# Patient Record
Sex: Male | Born: 1985 | Race: White | Hispanic: No | Marital: Married | Smoking: Never smoker
Health system: Southern US, Community
[De-identification: ages and names within clinical notes are randomized; demographics above are authoritative.]

## PROBLEM LIST (undated history)

## (undated) HISTORY — PX: HERNIA REPAIR: SHX51

---

## 2014-04-06 ENCOUNTER — Emergency Department: Payer: Self-pay | Admitting: Emergency Medicine

## 2014-04-14 ENCOUNTER — Emergency Department: Payer: Self-pay | Admitting: Emergency Medicine
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
## 2015-11-10 ENCOUNTER — Encounter: Payer: Self-pay | Admitting: Emergency Medicine

## 2015-11-10 ENCOUNTER — Emergency Department
Admission: EM | Admit: 2015-11-10 | Discharge: 2015-11-10 | Disposition: A | Discharge status: Home / Self Care | Attending: Emergency Medicine | Admitting: Emergency Medicine

## 2015-11-10 DIAGNOSIS — M79605 Pain in left leg: Secondary | ICD-10-CM | POA: Insufficient documentation

## 2015-11-10 DIAGNOSIS — M79604 Pain in right leg: Secondary | ICD-10-CM | POA: Diagnosis not present

## 2015-11-10 MED ORDER — NAPROXEN 500 MG PO TABS: 500.0000 mg | ORAL_TABLET | Freq: Two times a day (BID) | ORAL | Status: AC

## 2015-11-10 NOTE — ED Notes (Addendum)
BIL LEG PAIN - BEGAN AFTER RUNNING. HASN'T TAKEN MEDS BUT HAS BEEN TRYING TO STRETCH. PAIN TO INSIDES OF BOTH CALVES

## 2015-11-10 NOTE — ED Provider Notes (Signed)
[facility] Emergency Department Provider Note  ____________________________________________  Time seen: Approximately 9:57 PM  I have reviewed the triage vital signs and the nursing notes.   HISTORY  Chief Complaint Leg Pain    HPI Jorge Pierce is a 29 y.o. male patient complaining of bilateral leg pain. Patient state onset when started running. Patient state the pain is on the medial aspect of his calves. Patient stated no palliative measures taken for this complaint. Patient stated onset began 2 months ago when he restarted running program. Patient rates his pain as a 5/10.   History reviewed. No pertinent past medical history.  There are no active problems to display for this patient.   Past Surgical History  Procedure Laterality Date  . Hernia repair      Current Outpatient Rx  Name  Route  Sig  Dispense  Refill  . naproxen (NAPROSYN) 500 MG tablet   Oral   Take 1 tablet (500 mg total) by mouth 2 (two) times daily with a meal.   60 tablet   0     Allergies Review of patient's allergies indicates no known allergies.  No family history on file.  Social History Social History  Substance Use Topics  . Smoking status: Never Smoker   . Smokeless tobacco: None  . Alcohol Use: No    Review of Systems Constitutional: No fever/chills Eyes: No visual changes. ENT: No sore throat. Cardiovascular: Denies chest pain. Respiratory: Denies shortness of breath. Gastrointestinal: No abdominal pain.  No nausea, no vomiting.  No diarrhea.  No constipation. Genitourinary: Negative for dysuria. Musculoskeletal: Bilateral lower leg pain Skin: Negative for rash. Neurological: Negative for headaches, focal weakness or numbness. 10-point ROS otherwise negative.  ____________________________________________   PHYSICAL EXAM:  VITAL SIGNS: ED Triage Vitals  Enc Vitals Group     BP 11/10/15 2124 148/84 mmHg     Pulse Rate 11/10/15 2124 76   Resp --      Temp 11/10/15 2124 97 F (36.1 C)     Temp src --      SpO2 11/10/15 2124 100 %     Weight 11/10/15 2124 205 lb (92.987 kg)     Height 11/10/15 2124  (1.753 m)     Head Cir --      Peak Flow --      Pain Score --      Pain Loc --      Pain Edu? --      Excl. in GC? --     Constitutional: Alert and oriented. Well appearing and in no acute distress. Eyes: Conjunctivae are normal. PERRL. EOMI. Head: Atraumatic. Nose: No congestion/rhinnorhea. Mouth/Throat: Mucous membranes are moist.  Oropharynx non-erythematous. Neck: No stridor.  No cervical spine tenderness to palpation. Hematological/Lymphatic/Immunilogical: No cervical lymphadenopathy. Cardiovascular: Normal rate, regular rhythm. Grossly normal heart sounds.  Good peripheral circulation. Respiratory: Normal respiratory effort.  No retractions. Lungs CTAB. Gastrointestinal: Soft and nontender. No distention. No abdominal bruits. No CVA tenderness. Musculoskeletal: No deformity erythema or edema.Patient has moderate guarding palpation interosseous membranes. Patient demonstrates normal gait. Patient pain increases with squatting. Neurologic:  Normal speech and language. No gross focal neurologic deficits are appreciated. No gait instability. Skin:  Skin is warm, dry and intact. No rash noted. Psychiatric: Mood and affect are normal. Speech and behavior are normal.  ____________________________________________   LABS (all labs ordered are listed, but only abnormal results are displayed)  Labs Reviewed - No data to display ____________________________________________  EKG   ____________________________________________  RADIOLOGY   ____________________________________________   PROCEDURES  Procedure(s) performed: None  Critical Care performed: No  ____________________________________________   INITIAL IMPRESSION / ASSESSMENT AND PLAN / ED COURSE  Pertinent labs & imaging results that were  available during my care of the patient were reviewed by me and considered in my medical decision making (see chart for details).  Bilateral lower leg pain consistent with shin splints. Patient will be given contact information for orthopedics for further evaluation and treatment. Patient given discharge care instructions and a prescription for naproxen. ____________________________________________   FINAL CLINICAL IMPRESSION(S) / ED DIAGNOSES  Final diagnoses:  Leg pain, bilateral      Joni ReiningRonald K Debra Calabretta, PA-C 11/10/15 2214  Sharman CheekPhillip Stafford, MD 11/10/15 2329

## 2015-11-10 NOTE — ED Notes (Addendum)
Patient ambulatory to triage with steady gait, without difficulty or distress noted; pt reports bilat lower leg pain with running or when standing first thing in the morning [id number]; denies pain at present

## 2015-11-10 NOTE — ED Notes (Signed)
AAOx3.  Skin warm and dry. NAD.  Ambulates with easy and steady gait.
